# Patient Record
Sex: Female | Born: 1975
Health system: Southern US, Community
[De-identification: ages and names within clinical notes are randomized; demographics above are authoritative.]

---

## 2005-11-03 ENCOUNTER — Observation Stay: Payer: Self-pay | Admitting: Obstetrics & Gynecology

## 2006-02-18 ENCOUNTER — Inpatient Hospital Stay: Payer: Self-pay

## 2015-06-28 ENCOUNTER — Emergency Department: Payer: BLUE CROSS/BLUE SHIELD

## 2015-06-28 ENCOUNTER — Encounter: Payer: Self-pay | Admitting: Emergency Medicine

## 2015-06-28 ENCOUNTER — Emergency Department
Admission: EM | Admit: 2015-06-28 | Discharge: 2015-06-28 | Disposition: A | Discharge status: Home / Self Care | Payer: BLUE CROSS/BLUE SHIELD | Attending: Emergency Medicine | Admitting: Emergency Medicine

## 2015-06-28 DIAGNOSIS — Y92322 Soccer field as the place of occurrence of the external cause: Secondary | ICD-10-CM | POA: Diagnosis not present

## 2015-06-28 DIAGNOSIS — Y998 Other external cause status: Secondary | ICD-10-CM | POA: Diagnosis not present

## 2015-06-28 DIAGNOSIS — Y9366 Activity, soccer: Secondary | ICD-10-CM | POA: Diagnosis not present

## 2015-06-28 DIAGNOSIS — X58XXXA Exposure to other specified factors, initial encounter: Secondary | ICD-10-CM | POA: Diagnosis not present

## 2015-06-28 DIAGNOSIS — S93402A Sprain of unspecified ligament of left ankle, initial encounter: Secondary | ICD-10-CM | POA: Insufficient documentation

## 2015-06-28 DIAGNOSIS — S99912A Unspecified injury of left ankle, initial encounter: Secondary | ICD-10-CM | POA: Diagnosis present

## 2015-06-28 MED ORDER — HYDROCODONE-ACETAMINOPHEN 5-325 MG PO TABS: 1.0000 | ORAL_TABLET | ORAL | Status: DC | PRN

## 2015-06-28 MED ORDER — IBUPROFEN 800 MG PO TABS: 800.0000 mg | ORAL_TABLET | Freq: Three times a day (TID) | ORAL | Status: DC | PRN

## 2015-06-28 NOTE — ED Notes (Signed)
Patient to ED with c/o left ankle pain after twisting while playing ball.

## 2015-06-28 NOTE — ED Notes (Signed)
Pt hurt ankle playing soccer. States pain is about a 4 but every so often increases to a 6. Ice applied.

## 2015-06-28 NOTE — ED Provider Notes (Signed)
[facility] Emergency Department Provider Note  ____________________________________________  Time seen: Approximately 10:57 PM  I have reviewed the triage vital signs and the nursing notes.   HISTORY  Chief Complaint Ankle Pain    HPI Kimberly Rich is a 39 y.o. female who presents for evaluation of left ankle pain secondary to soccer injury. Complains of increased swelling and pain to the lateral dorsal aspect of the left ankle.   No past medical history on file.  There are no active problems to display for this patient.   No past surgical history on file.  Current Outpatient Rx  Name  Route  Sig  Dispense  Refill  . HYDROcodone-acetaminophen (NORCO) 5-325 MG per tablet   Oral   Take 1-2 tablets by mouth every 4 (four) hours as needed for moderate pain.   12 tablet   0   . ibuprofen (ADVIL,MOTRIN) 800 MG tablet   Oral   Take 1 tablet (800 mg total) by mouth every 8 (eight) hours as needed.   30 tablet   0     Allergies Review of patient's allergies indicates no known allergies.  No family history on file.  Social History History  Substance Use Topics  . Smoking status: Never Smoker   . Smokeless tobacco: Not on file  . Alcohol Use: No    Review of Systems Constitutional: No fever/chills Eyes: No visual changes. ENT: No sore throat. Cardiovascular: Denies chest pain. Respiratory: Denies shortness of breath. Gastrointestinal: No abdominal pain.  No nausea, no vomiting.  No diarrhea.  No constipation. Genitourinary: Negative for dysuria. Musculoskeletal: Positive for left ankle pain. Skin: Negative for rash. Neurological: Negative for headaches, focal weakness or numbness.  10-point ROS otherwise negative.  ____________________________________________   PHYSICAL EXAM:  VITAL SIGNS: ED Triage Vitals  Enc Vitals Group     BP 06/28/15 2031 109/60 mmHg     Pulse Rate 06/28/15 2031 66     Resp 06/28/15 2031 20   Temp 06/28/15 2031 98.8 F (37.1 C)     Temp Source 06/28/15 2031 Oral     SpO2 06/28/15 2031 99 %     Weight 06/28/15 2031 130 lb (58.968 kg)     Height 06/28/15 2031 5\' 2"  (1.575 m)     Head Cir --      Peak Flow --      Pain Score 06/28/15 2025 6     Pain Loc --      Pain Edu? --      Excl. in GC? --     Constitutional: Alert and oriented. Well appearing and in no acute distress. Eyes: Conjunctivae are normal. PERRL. EOMI. Head: Atraumatic. Nose: No congestion/rhinnorhea. Mouth/Throat: Mucous membranes are moist.  Oropharynx non-erythematous. Neck: No stridor.   Cardiovascular: Normal rate, regular rhythm. Grossly normal heart sounds.  Good peripheral circulation. Respiratory: Normal respiratory effort.  No retractions. Lungs CTAB. Gastrointestinal: Soft and nontender. No distention. No abdominal bruits. No CVA tenderness. Musculoskeletal: No lower extremity tenderness nor edema.  No joint effusions. Neurologic:  Normal speech and language. No gross focal neurologic deficits are appreciated. No gait instability. Skin:  Skin is warm, dry and intact. No rash noted. Psychiatric: Mood and affect are normal. Speech and behavior are normal.  ____________________________________________   LABS (all labs ordered are listed, but only abnormal results are displayed)  Labs Reviewed - No data to display ____________________________________________   RADIOLOGY  Left ankle x-ray negative interpreted by radiologist and reviewed by myself.  ____________________________________________   PROCEDURES  Procedure(s) performed: None  Critical Care performed: No  ____________________________________________   INITIAL IMPRESSION / ASSESSMENT AND PLAN / ED COURSE  Pertinent labs & imaging results that were available during my care of the patient were reviewed by me and considered in my medical decision making (see chart for details).  Soft Ace wrap applied. Patient refuses crutches.  Rx given hydrocodone and ibuprofen. Will follow up with PCP or orthopedics if continued pain. She voices no other emergency medical complaints at this time. ____________________________________________   FINAL CLINICAL IMPRESSION(S) / ED DIAGNOSES  Final diagnoses:  Ankle sprain, left, initial encounter      Evangeline Dakin, PA-C 06/28/15 2311  Maurilio Lovely, MD 06/29/15 0005

## 2015-06-28 NOTE — Discharge Instructions (Signed)

## 2016-06-13 DIAGNOSIS — H00031 Abscess of right upper eyelid: Secondary | ICD-10-CM | POA: Diagnosis not present

## 2017-04-04 DIAGNOSIS — L700 Acne vulgaris: Secondary | ICD-10-CM | POA: Diagnosis not present

## 2017-07-11 DIAGNOSIS — L700 Acne vulgaris: Secondary | ICD-10-CM | POA: Diagnosis not present

## 2018-02-27 ENCOUNTER — Encounter: Payer: Self-pay | Admitting: Obstetrics and Gynecology

## 2018-02-27 ENCOUNTER — Ambulatory Visit (INDEPENDENT_AMBULATORY_CARE_PROVIDER_SITE_OTHER): Payer: BLUE CROSS/BLUE SHIELD | Admitting: Obstetrics and Gynecology

## 2018-02-27 VITALS — BP 102/68 | HR 58 | Ht 62.0 in | Wt 138.0 lb

## 2018-02-27 DIAGNOSIS — Z01419 Encounter for gynecological examination (general) (routine) without abnormal findings: Secondary | ICD-10-CM | POA: Diagnosis not present

## 2018-02-27 DIAGNOSIS — Z1239 Encounter for other screening for malignant neoplasm of breast: Secondary | ICD-10-CM

## 2018-02-27 DIAGNOSIS — Z1151 Encounter for screening for human papillomavirus (HPV): Secondary | ICD-10-CM | POA: Diagnosis not present

## 2018-02-27 DIAGNOSIS — Z124 Encounter for screening for malignant neoplasm of cervix: Secondary | ICD-10-CM | POA: Diagnosis not present

## 2018-02-27 DIAGNOSIS — N6320 Unspecified lump in the left breast, unspecified quadrant: Secondary | ICD-10-CM

## 2018-02-27 DIAGNOSIS — Z1321 Encounter for screening for nutritional disorder: Secondary | ICD-10-CM

## 2018-02-27 DIAGNOSIS — Z1231 Encounter for screening mammogram for malignant neoplasm of breast: Secondary | ICD-10-CM

## 2018-02-27 DIAGNOSIS — Z13220 Encounter for screening for lipoid disorders: Secondary | ICD-10-CM

## 2018-02-27 DIAGNOSIS — Z0000 Encounter for general adult medical examination without abnormal findings: Secondary | ICD-10-CM

## 2018-02-27 NOTE — Patient Instructions (Signed)
I value your feedback and entrusting us with your care. If you get a [facility] patient survey, I would appreciate you taking the time to let us know about your experience today. Thank you! 

## 2018-02-27 NOTE — Progress Notes (Signed)
PCP:  Patient, No Pcp Per   Chief Complaint  Patient presents with  . Gynecologic Exam    Lump in left breast, with some achiness on left side into armpit      HPI:      Ms. Kimberly Rich is a 42 y.o. [id number] who LMP was Patient's last menstrual period was 02/10/2018 (exact date)., presents today for her NP annual examination.  Her menses are regular every 28-30 days, lasting 5 days.  Dysmenorrhea mild, occurring first 1-2 days of flow. She does not have intermenstrual bleeding.  Sex activity: single partner, contraception - vasectomy.  Last Pap: not recently  Last mammogram: never There is no FH of breast cancer. There is no FH of ovarian cancer. The patient does do self-breast exams. She noticed a LT breast mass several months ago. No change in size since first palpated. No tenderness of mass but LT axillary area is intermittently tender/achy but not related to menses. No hx of breast mass. Drinks caffeine.  Tobacco use: The patient denies current or previous tobacco use. Alcohol use: none No drug use.  Exercise: moderately active  She does not get adequate calcium and Vitamin D in her diet.  No recent labs.   History reviewed. No pertinent past medical history.  Past Surgical History:  Procedure Laterality Date  . CESAREAN SECTION  [phone]    Family History  Problem Relation Age of Onset  . Diabetes Mother     Social History   Socioeconomic History  . Marital status: Married    Spouse name: Not on file  . Number of children: Not on file  . Years of education: Not on file  . Highest education level: Not on file  Occupational History  . Not on file  Social Needs  . Financial resource strain: Not on file  . Food insecurity:    Worry: Not on file    Inability: Not on file  . Transportation needs:    Medical: Not on file    Non-medical: Not on file  Tobacco Use  . Smoking status: Never Smoker  . Smokeless tobacco: Never Used  Substance and  Sexual Activity  . Alcohol use: No  . Drug use: No  . Sexual activity: Yes    Birth control/protection: None    Comment: Vasectomy   Lifestyle  . Physical activity:    Days per week: Not on file    Minutes per session: Not on file  . Stress: Not on file  Relationships  . Social connections:    Talks on phone: Not on file    Gets together: Not on file    Attends religious service: Not on file    Active member of club or organization: Not on file    Attends meetings of clubs or organizations: Not on file    Relationship status: Not on file  . Intimate partner violence:    Fear of current or ex partner: Not on file    Emotionally abused: Not on file    Physically abused: Not on file    Forced sexual activity: Not on file  Other Topics Concern  . Not on file  Social History Narrative  . Not on file    Outpatient Medications Prior to Visit  Medication Sig Dispense Refill  . HYDROcodone-acetaminophen (NORCO) 5-325 MG per tablet Take 1-2 tablets by mouth every 4 (four) hours as needed for moderate pain. (Patient not taking: Reported on 02/27/2018) 12 tablet 0  .  ibuprofen (ADVIL,MOTRIN) 800 MG tablet Take 1 tablet (800 mg total) by mouth every 8 (eight) hours as needed. (Patient not taking: Reported on 02/27/2018) 30 tablet 0   No facility-administered medications prior to visit.       ROS:  Review of Systems  Constitutional: Negative for fatigue, fever and unexpected weight change.  Respiratory: Negative for cough, shortness of breath and wheezing.   Cardiovascular: Negative for chest pain, palpitations and leg swelling.  Gastrointestinal: Negative for blood in stool, constipation, diarrhea, nausea and vomiting.  Endocrine: Negative for cold intolerance, heat intolerance and polyuria.  Genitourinary: Positive for vaginal discharge. Negative for dyspareunia, dysuria, flank pain, frequency, genital sores, hematuria, menstrual problem, pelvic pain, urgency, vaginal bleeding and  vaginal pain.  Musculoskeletal: Negative for back pain, joint swelling and myalgias.  Skin: Negative for rash.  Neurological: Negative for dizziness, syncope, light-headedness, numbness and headaches.  Hematological: Negative for adenopathy.  Psychiatric/Behavioral: Negative for agitation, confusion, sleep disturbance and suicidal ideas. The patient is not nervous/anxious.    BREAST: mass, tenderness   Objective: BP 102/68   Pulse (!) 58   Ht 5\' 2"  (1.575 m)   Wt 138 lb (62.6 kg)   LMP 02/10/2018 (Exact Date)   BMI 25.24 kg/m    Physical Exam  Constitutional: She is oriented to person, place, and time. She appears well-developed and well-nourished.  Genitourinary: Vagina normal and uterus normal. There is no rash or tenderness on the right labia. There is no rash or tenderness on the left labia. No erythema or tenderness in the vagina. No vaginal discharge found. Right adnexum does not display mass and does not display tenderness. Left adnexum does not display mass and does not display tenderness. Cervix does not exhibit motion tenderness or polyp. Uterus is not enlarged or tender.  Neck: Normal range of motion. No thyromegaly present.  Cardiovascular: Normal rate, regular rhythm and normal heart sounds.  No murmur heard. Pulmonary/Chest: Effort normal and breath sounds normal. Right breast exhibits no mass, no nipple discharge, no skin change and no tenderness. Left breast exhibits no mass, no nipple discharge, no skin change and no tenderness.  LT BREAST UNDER AREOLA ~8:00-10:00 POS WITH ~2 X 3.5 CM FIRM, MOBILE MASS; NT; LT AXILLA IS TENDER TO PALPATION WITHOUT MASS; NO ERYTHEMA    Abdominal: Soft. There is no tenderness. There is no guarding.  Musculoskeletal: Normal range of motion.  Neurological: She is alert and oriented to person, place, and time. No cranial nerve deficit.  Psychiatric: She has a normal mood and affect. Her behavior is normal.  Vitals  reviewed.   Assessment/Plan: Encounter for annual routine gynecological examination  Cervical cancer screening - Plan: IGP, Aptima HPV  Screening for HPV (human papillomavirus) - Plan: IGP, Aptima HPV  Left breast mass - 8:00-10:00 pos, check dx mammo and u/s. Will f/u wiht results.  - Plan: MM DIAG BREAST TOMO BILATERAL, US BREAST LTD UNI LEFT INC AXILLA  Screening for breast cancer - Plan: MM DIAG BREAST TOMO BILATERAL, US BREAST LTD UNI LEFT INC AXILLA  Blood tests for routine general physical examination - Plan: Comprehensive metabolic panel, Lipid panel, VITAMIN D 25 Hydroxy (Vit-D Deficiency, Fractures)  Screening cholesterol level - Plan: Lipid panel  Encounter for vitamin deficiency screening - Plan: VITAMIN D 25 Hydroxy (Vit-D Deficiency, Fractures)     GYN counsel breast self exam, mammography screening, adequate intake of calcium and vitamin D, diet and exercise     F/U  Return in about 1 year (  around 02/28/2019).  Alicia B. Copland, PA-C 02/27/2018 9:24 AM

## 2018-03-02 LAB — IGP, APTIMA HPV
HPV Aptima: NEGATIVE
PAP Smear Comment: 0

## 2018-03-07 ENCOUNTER — Other Ambulatory Visit: Payer: BLUE CROSS/BLUE SHIELD

## 2018-03-07 DIAGNOSIS — Z13220 Encounter for screening for lipoid disorders: Secondary | ICD-10-CM

## 2018-03-07 DIAGNOSIS — Z0000 Encounter for general adult medical examination without abnormal findings: Secondary | ICD-10-CM

## 2018-03-07 DIAGNOSIS — Z1321 Encounter for screening for nutritional disorder: Secondary | ICD-10-CM | POA: Diagnosis not present

## 2018-03-08 ENCOUNTER — Other Ambulatory Visit: Payer: Self-pay | Admitting: Obstetrics and Gynecology

## 2018-03-08 ENCOUNTER — Ambulatory Visit
Admission: RE | Admit: 2018-03-08 | Discharge: 2018-03-08 | Disposition: A | Discharge status: Home / Self Care | Payer: BLUE CROSS/BLUE SHIELD | Source: Ambulatory Visit | Attending: Obstetrics and Gynecology | Admitting: Obstetrics and Gynecology

## 2018-03-08 DIAGNOSIS — N6310 Unspecified lump in the right breast, unspecified quadrant: Secondary | ICD-10-CM

## 2018-03-08 DIAGNOSIS — N6320 Unspecified lump in the left breast, unspecified quadrant: Secondary | ICD-10-CM

## 2018-03-08 DIAGNOSIS — Z1239 Encounter for other screening for malignant neoplasm of breast: Secondary | ICD-10-CM

## 2018-03-08 DIAGNOSIS — N6323 Unspecified lump in the left breast, lower outer quadrant: Secondary | ICD-10-CM | POA: Diagnosis not present

## 2018-03-08 DIAGNOSIS — N6002 Solitary cyst of left breast: Secondary | ICD-10-CM | POA: Diagnosis not present

## 2018-03-08 DIAGNOSIS — N6321 Unspecified lump in the left breast, upper outer quadrant: Secondary | ICD-10-CM | POA: Insufficient documentation

## 2018-03-08 DIAGNOSIS — R922 Inconclusive mammogram: Secondary | ICD-10-CM | POA: Diagnosis not present

## 2018-03-08 DIAGNOSIS — N6001 Solitary cyst of right breast: Secondary | ICD-10-CM | POA: Insufficient documentation

## 2018-03-08 LAB — COMPREHENSIVE METABOLIC PANEL
ALK PHOS: 54 IU/L (ref 39–117)
ALT: 7 IU/L (ref 0–32)
AST: 13 IU/L (ref 0–40)
Albumin/Globulin Ratio: 1.7 (ref 1.2–2.2)
Albumin: 4 g/dL (ref 3.5–5.5)
BUN/Creatinine Ratio: 18 (ref 9–23)
BUN: 14 mg/dL (ref 6–24)
CHLORIDE: 104 mmol/L (ref 96–106)
CO2: 24 mmol/L (ref 20–29)
Calcium: 8.9 mg/dL (ref 8.7–10.2)
Creatinine, Ser: 0.78 mg/dL (ref 0.57–1.00)
GFR calc Af Amer: 110 mL/min/{1.73_m2} (ref >59)
GFR calc non Af Amer: 95 mL/min/{1.73_m2} (ref >59)
GLUCOSE: 84 mg/dL (ref 65–99)
Globulin, Total: 2.3 g/dL (ref 1.5–4.5)
Potassium: 4.3 mmol/L (ref 3.5–5.2)
Sodium: 143 mmol/L (ref 134–144)
Total Protein: 6.3 g/dL (ref 6.0–8.5)

## 2018-03-08 LAB — VITAMIN D 25 HYDROXY (VIT D DEFICIENCY, FRACTURES): Vit D, 25-Hydroxy: 22.3 ng/mL — ABNORMAL LOW (ref 30.0–100.0)

## 2018-03-08 LAB — LIPID PANEL
CHOL/HDL RATIO: 3.2 ratio (ref 0.0–4.4)
Cholesterol, Total: 167 mg/dL (ref 100–199)
HDL: 52 mg/dL (ref >39)
LDL Calculated: 104 mg/dL — ABNORMAL HIGH (ref 0–99)
Triglycerides: 55 mg/dL (ref 0–149)
VLDL Cholesterol Cal: 11 mg/dL (ref 5–40)

## 2018-03-13 ENCOUNTER — Telehealth: Payer: Self-pay | Admitting: Obstetrics and Gynecology

## 2018-03-13 DIAGNOSIS — N6320 Unspecified lump in the left breast, unspecified quadrant: Secondary | ICD-10-CM

## 2018-03-13 NOTE — Telephone Encounter (Signed)
Pt aware of mammo results. She is ok to repeat u/s in 6 months. Knows to f/u sooner if painful or size increase. Would refer to gen surg for mgmt. F/u prn.

## 2018-03-14 ENCOUNTER — Telehealth: Payer: Self-pay | Admitting: Obstetrics and Gynecology

## 2018-03-14 NOTE — Telephone Encounter (Signed)
Patient is [facility] of [facility] mos [facility] on Monday, 09/09/18 @ 9:20pm. Patient said she just saw the appointment on MyChart.

## 2018-09-09 ENCOUNTER — Other Ambulatory Visit: Payer: BLUE CROSS/BLUE SHIELD

## 2019-11-04 ENCOUNTER — Emergency Department
Admission: EM | Admit: 2019-11-04 | Discharge: 2019-11-04 | Disposition: A | Discharge status: Home / Self Care | Payer: PRIVATE HEALTH INSURANCE | Attending: Emergency Medicine | Admitting: Emergency Medicine

## 2019-11-04 ENCOUNTER — Emergency Department: Payer: PRIVATE HEALTH INSURANCE

## 2019-11-04 ENCOUNTER — Encounter: Payer: Self-pay | Admitting: Emergency Medicine

## 2019-11-04 ENCOUNTER — Other Ambulatory Visit: Payer: Self-pay

## 2019-11-04 DIAGNOSIS — S0003XA Contusion of scalp, initial encounter: Secondary | ICD-10-CM | POA: Diagnosis not present

## 2019-11-04 DIAGNOSIS — Y990 Civilian activity done for income or pay: Secondary | ICD-10-CM | POA: Diagnosis not present

## 2019-11-04 DIAGNOSIS — Y9389 Activity, other specified: Secondary | ICD-10-CM | POA: Diagnosis not present

## 2019-11-04 DIAGNOSIS — S0990XA Unspecified injury of head, initial encounter: Secondary | ICD-10-CM | POA: Diagnosis present

## 2019-11-04 DIAGNOSIS — R202 Paresthesia of skin: Secondary | ICD-10-CM | POA: Diagnosis not present

## 2019-11-04 DIAGNOSIS — W208XXA Other cause of strike by thrown, projected or falling object, initial encounter: Secondary | ICD-10-CM | POA: Diagnosis not present

## 2019-11-04 DIAGNOSIS — Y9289 Other specified places as the place of occurrence of the external cause: Secondary | ICD-10-CM | POA: Diagnosis not present

## 2019-11-04 NOTE — ED Notes (Addendum)
[phone] and [phone] [employer] Benefits and Workers Comp, attempted to call both numbers without success to verify whether or not WC UDS was needed.

## 2019-11-04 NOTE — ED Provider Notes (Signed)
Emergency Department Provider Note  ____________________________________________  Time seen: Approximately 4:51 PM  I have reviewed the triage vital signs and the nursing notes.   HISTORY  Chief Complaint Head Laceration   Historian Patient     HPI Kimberly Rich is a 43 y.o. female presents to the emergency department with new onset left-sided parietal scalp numbness and diminished sensation after patient reports that a 40 pound kayak fell on her head yesterday while unloading.  Patient sustained a 2 cm laceration to left side of forehead which she Steri-Stripped at home.  Patient states that she had a mild headache at the time which is since resolved but became concerned about changes in scalp sensation.  She denies numbness or tingling in the upper or lower extremities.  No neck pain.  She denies blurry vision or vertigo.  No changes in strength in the upper or lower extremities.  She has been able to ambulate easily and has never experienced similar symptoms in the past.  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  Patient states that if her CT head is nonconcerning, she would like to return to work today.   History reviewed. No pertinent past medical history.   Immunizations up to date:  Yes.     History reviewed. No pertinent past medical history.  There are no problems to display for this patient.   Past Surgical History:  Procedure Laterality Date  . CESAREAN SECTION  [phone]    Prior to Admission medications   Not on File    Allergies Patient has no known allergies.  Family History  Problem Relation Age of Onset  . Diabetes Mother   . Hypertension Father   . Hyperlipidemia Father     Social History Social History   Tobacco Use  . Smoking status: Never Smoker  . Smokeless tobacco: Never Used  Substance Use Topics  . Alcohol use: No  . Drug use: No     Review of Systems   Constitutional: No fever/chills Eyes:  No discharge ENT: No upper respiratory complaints. Respiratory: no cough. No SOB/ use of accessory muscles to breath Gastrointestinal:   No nausea, no vomiting.  No diarrhea.  No constipation. Musculoskeletal: Negative for musculoskeletal pain. Skin: Patient has forehead laceration and scalp paresthesias.    ____________________________________________   PHYSICAL EXAM:  VITAL SIGNS: ED Triage Vitals  Enc Vitals Group     BP 11/04/19 1549 118/62     Pulse Rate 11/04/19 1549 (!) 56     Resp 11/04/19 1549 18     Temp 11/04/19 1549 98.9 F (37.2 C)     Temp Source 11/04/19 1549 Oral     SpO2 11/04/19 1549 99 %     Weight 11/04/19 1550 130 lb (59 kg)     Height 11/04/19 1550 5\' 2"  (1.575 m)     Head Circumference --      Peak Flow --      Pain Score 11/04/19 1549 0     Pain Loc --      Pain Edu? --      Excl. in GC? --      Constitutional: Alert and oriented. Well appearing and in no acute distress. Eyes: Conjunctivae are normal. PERRL. EOMI. Head: Atraumatic.  No palpable hematomas. ENT:      Nose: No congestion/rhinnorhea.      Mouth/Throat: Mucous membranes are moist.  Neck: No stridor.  No cervical spine tenderness to palpation. Cardiovascular: Normal rate, regular rhythm. Normal S1 and S2.  Good peripheral circulation. Respiratory: Normal respiratory effort without tachypnea or retractions. Lungs CTAB. Good air entry to the bases with no decreased or absent breath sounds Gastrointestinal: Bowel sounds x 4 quadrants. Soft and nontender to palpation. No guarding or rigidity. No distention. Musculoskeletal: Full range of motion to all extremities. No obvious deformities noted Neurologic:  Normal for age. No gross focal neurologic deficits are appreciated.  Skin: Patient has 2 cm left-sided forehead laceration that was Steri-Stripped in place. Psychiatric: Mood and affect are normal for age. Speech and behavior are normal.    ____________________________________________   LABS (all labs ordered are listed, but only abnormal results are displayed)  Labs Reviewed - No data to display ____________________________________________  EKG   ____________________________________________  RADIOLOGY Unk Pinto, personally viewed and evaluated these images (plain radiographs) as part of my medical decision making, as well as reviewing the written report by the radiologist.    CT Head Wo Contrast  Result Date: 11/04/2019 CLINICAL DATA:  Facial trauma with laceration. EXAM: CT HEAD WITHOUT CONTRAST TECHNIQUE: Contiguous axial images were obtained from the base of the skull through the vertex without intravenous contrast. COMPARISON:  None. FINDINGS: Brain: The ventricles are normal in size and configuration. No extra-axial fluid collections are identified. The gray-white differentiation is maintained. No CT findings for acute hemispheric infarction or intracranial hemorrhage. No mass lesions. The brainstem and cerebellum are normal. Vascular: No hyperdense vessels or obvious aneurysm. Skull: No acute skull fracture.  No bone lesion. Sinuses/Orbits: The paranasal sinuses and mastoid air cells are clear. The globes are intact. Other: No scalp lesions, laceration or hematoma. IMPRESSION: No acute intracranial findings or skull fracture. Electronically Signed   By: Marijo Sanes M.D.   On: 11/04/2019 17:29    ____________________________________________    PROCEDURES  Procedure(s) performed:     Procedures     Medications - No data to display   ____________________________________________   INITIAL IMPRESSION / ASSESSMENT AND PLAN / ED COURSE  Pertinent labs & imaging results that were available during my care of the patient were reviewed by me and considered in my medical decision making (see chart for details).      Assessment and Plan: Head Contusion:  43 year old female presents to the  emergency department with changes in scalp sensation after patient reports that she was struck in the head while unloading a kayak.  Patient did not lose consciousness and has not been having vertigo, blurry vision, disorientation or confusion.  She does not currently have a headache.  Patient was mildly bradycardic at triage but vital signs were otherwise reassuring.  Neuro exam was reassuring.  Patient was able to perform rapid alternating movements and had no hypo or hyperreflexia.  Romberg test was negative.  Differential diagnosis includes concussion, head contusion, intracranial bleed, skull fracture...  CT head reveals no evidence of intracranial bleed or skull fracture.  Tylenol was recommended for discomfort.  All patient questions were answered.  ____________________________________________  FINAL CLINICAL IMPRESSION(S) / ED DIAGNOSES  Final diagnoses:  Contusion of scalp, initial encounter      NEW MEDICATIONS STARTED DURING THIS VISIT:  ED Discharge Orders    None          This chart was dictated using voice recognition software/Dragon. Despite best efforts to proofread, errors can occur which can change the meaning. Any change was purely unintentional.     Vallarie Mare  Blair HeysM, PA-C 11/04/19 1739    Arnaldo NatalMalinda, Paul F, MD 11/04/19 [phone]

## 2019-11-04 NOTE — ED Notes (Signed)
See triage note  Presents s/p w/c injury  States she fell yesterday  And had head laceration  Butterfly suture in place  Sent over from [facility]

## 2019-11-04 NOTE — ED Notes (Signed)
First RN Note: Pt presents to ED via wheelchair from [facility] with c/o fall/laceration to L side of her head. Per [facility] pt is WC, injury happened yesterday, pt with butterfly stitches to L side of her head at this time. Pt is A&O x 4, ambulatory without difficulty at this time.

## 2019-11-04 NOTE — ED Triage Notes (Signed)
Pt denies LOC, ambulatory with steady gait at this time. A&O x4. Pt states numb sensation to L side of her head when the advil wears off along the area wear the laceration is.

## 2019-11-17 DIAGNOSIS — R519 Headache, unspecified: Secondary | ICD-10-CM | POA: Diagnosis not present

## 2019-11-17 DIAGNOSIS — R0981 Nasal congestion: Secondary | ICD-10-CM | POA: Diagnosis not present

## 2019-11-17 DIAGNOSIS — Z20828 Contact with and (suspected) exposure to other viral communicable diseases: Secondary | ICD-10-CM | POA: Diagnosis not present

## 2020-02-10 DIAGNOSIS — S61215A Laceration without foreign body of left ring finger without damage to nail, initial encounter: Secondary | ICD-10-CM | POA: Diagnosis not present

## 2020-02-10 DIAGNOSIS — Z23 Encounter for immunization: Secondary | ICD-10-CM | POA: Diagnosis not present

## 2020-04-15 DIAGNOSIS — Z03818 Encounter for observation for suspected exposure to other biological agents ruled out: Secondary | ICD-10-CM | POA: Diagnosis not present

## 2020-04-15 DIAGNOSIS — Z20828 Contact with and (suspected) exposure to other viral communicable diseases: Secondary | ICD-10-CM | POA: Diagnosis not present

## 2020-06-18 DIAGNOSIS — Z03818 Encounter for observation for suspected exposure to other biological agents ruled out: Secondary | ICD-10-CM | POA: Diagnosis not present

## 2020-06-18 DIAGNOSIS — Z20822 Contact with and (suspected) exposure to covid-19: Secondary | ICD-10-CM | POA: Diagnosis not present

## 2021-08-23 IMAGING — CT CT HEAD W/O CM
3 series · 15 of 46 positions shown, 18 images · non-contrast
Comparison: None.

CLINICAL DATA: Facial trauma with laceration.

EXAM:
CT HEAD WITHOUT CONTRAST
TECHNIQUE: Contiguous axial images were obtained from the base of the skull
through the vertex without intravenous contrast.

[Series 2: head wo · axial · 0.42mm/px · z∈[-121,-1]mm · 9 of 29 slices shown, 12 images]
[im 3/29  brain]
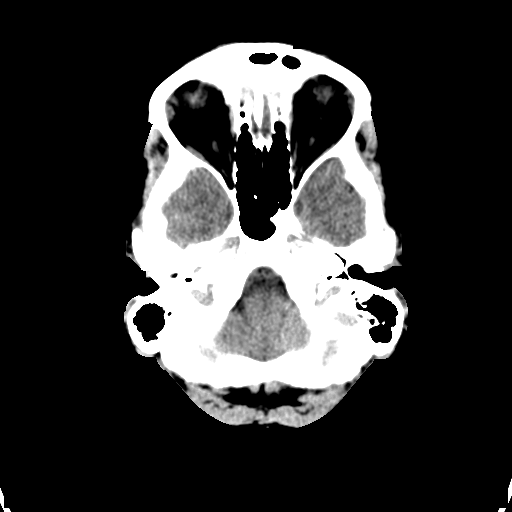
[im 3/29  bone]
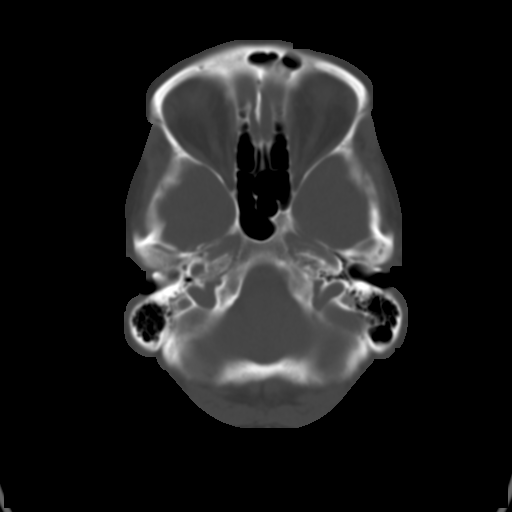
[im 6/29  brain]
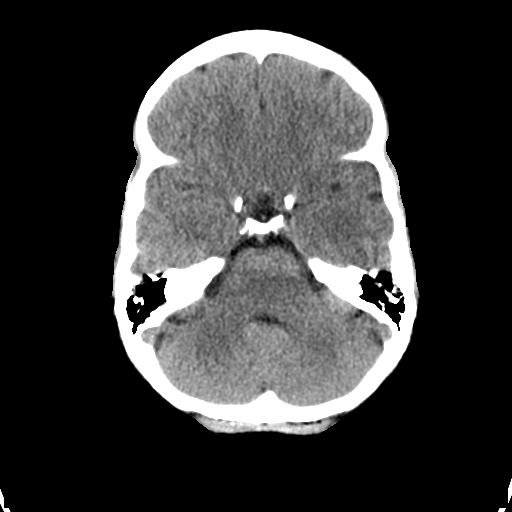
[im 9/29  brain]
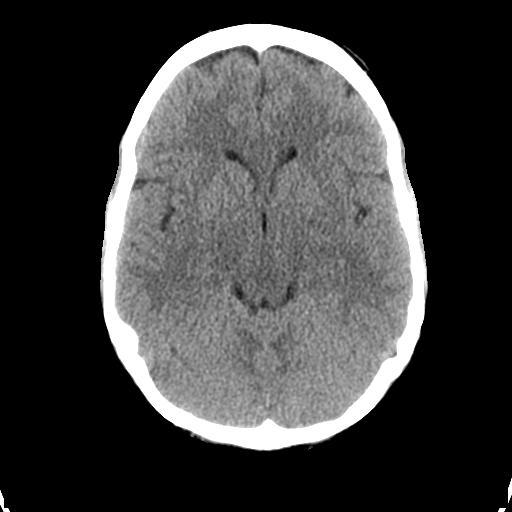
[im 12/29  brain]
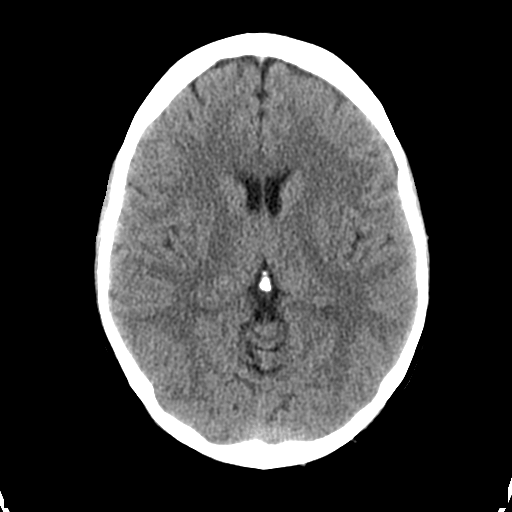
[im 15/29  brain]
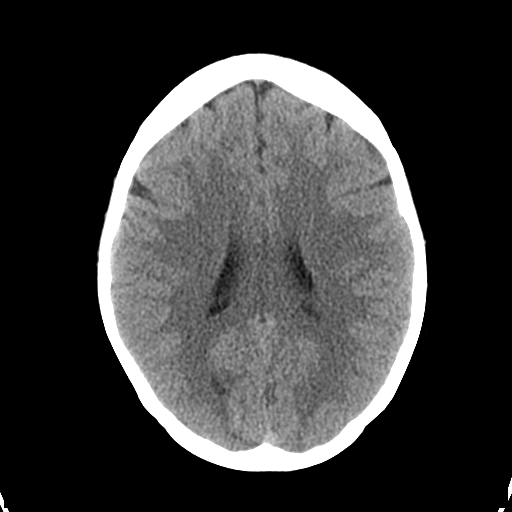
[im 15/29  bone]
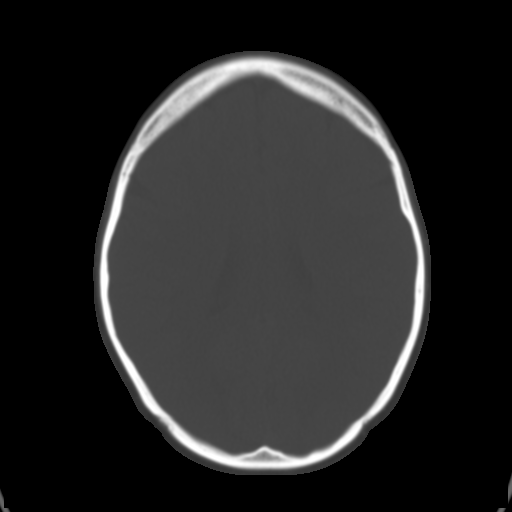
[im 18/29  brain]
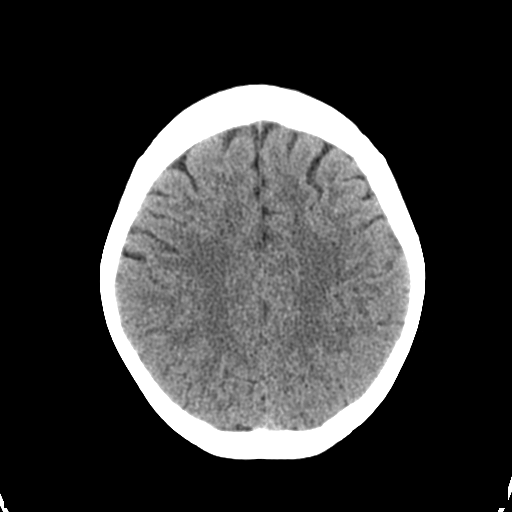
[im 21/29  brain]
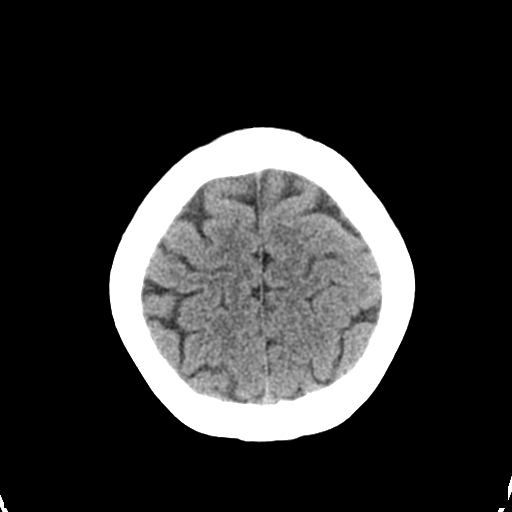
[im 24/29  brain]
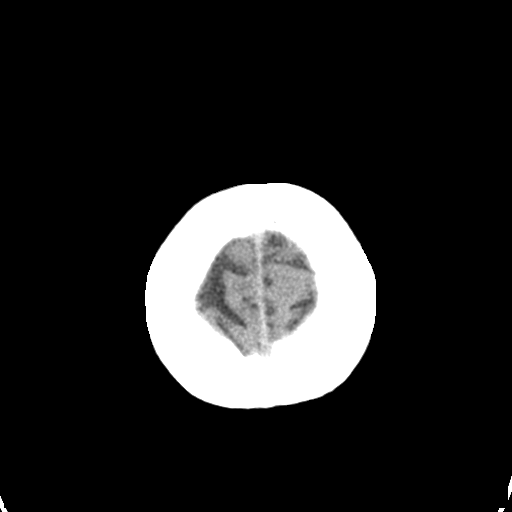
[im 27/29  brain]
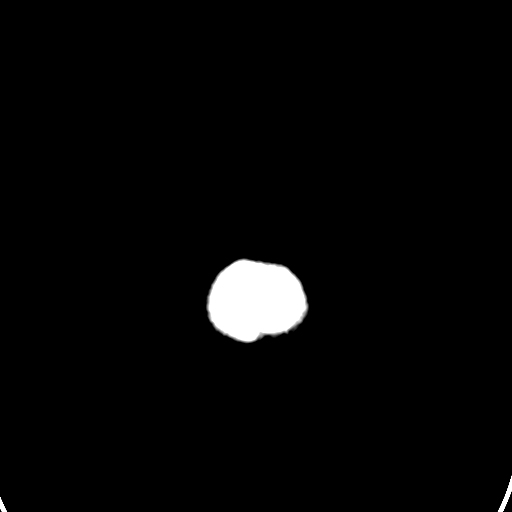
[im 27/29  bone]
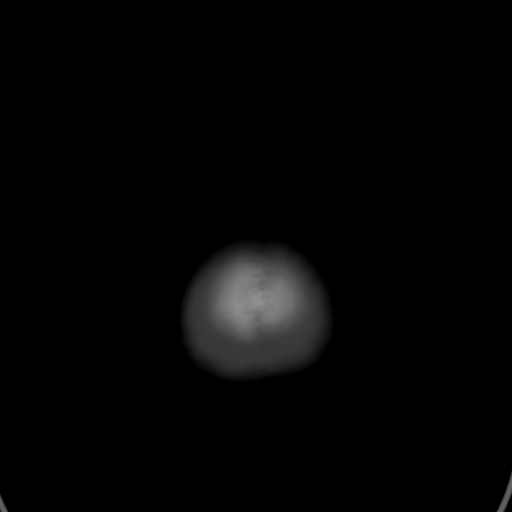

[Series 4: coronal soft tissue · coronal · 0.29mm/px · 3 of 64 slices shown]
[im 22/64  brain]
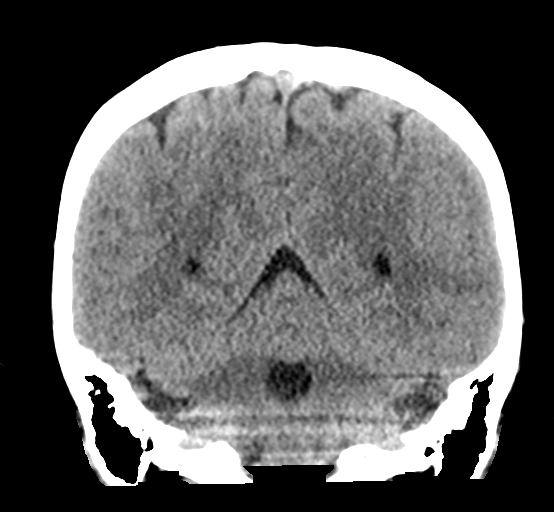
[im 29/64  brain]
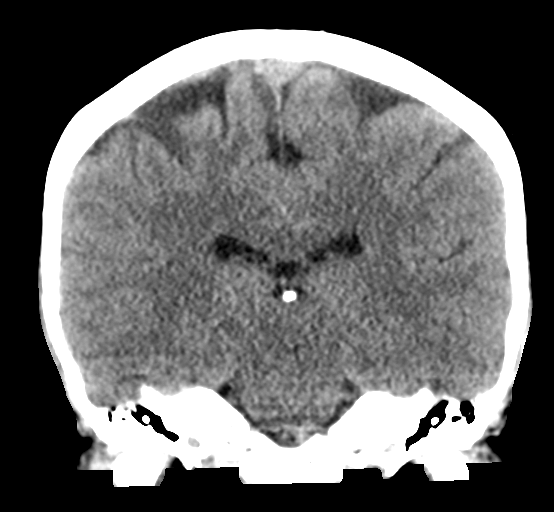
[im 36/64  brain]
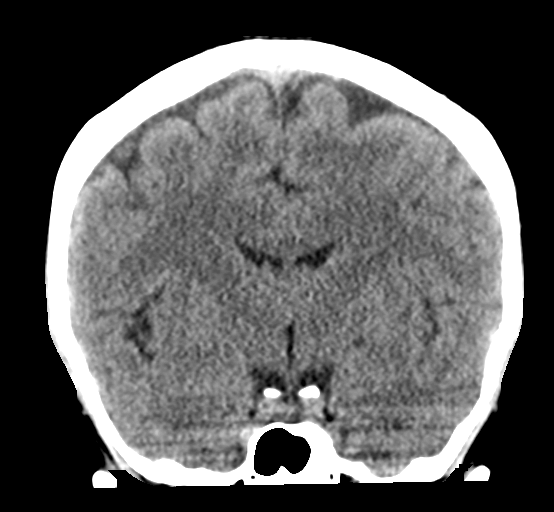

[Series 5: sagittal soft tissue · sagittal · 0.29mm/px · 3 of 48 slices shown]
[im 16/48  brain]
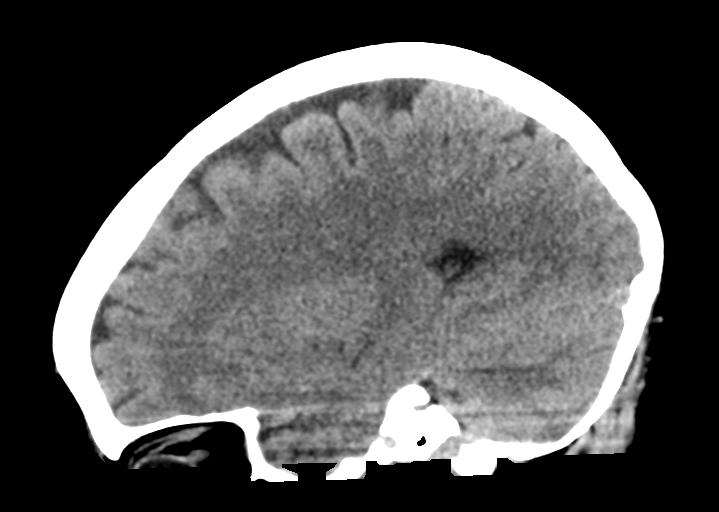
[im 24/48  brain]
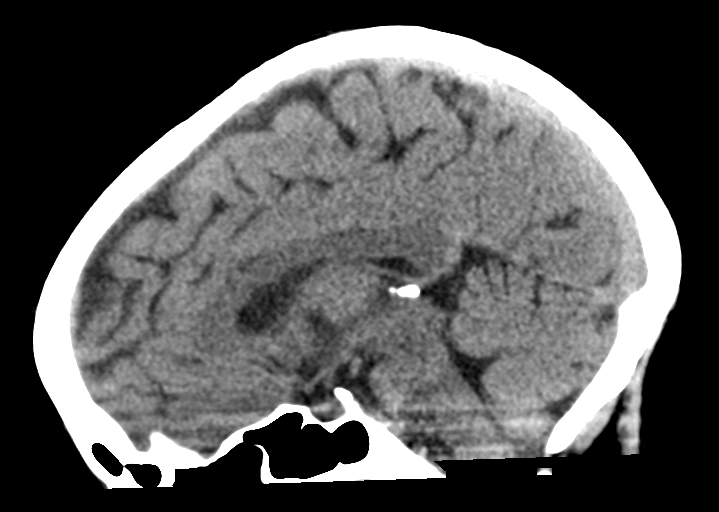
[im 32/48  brain]
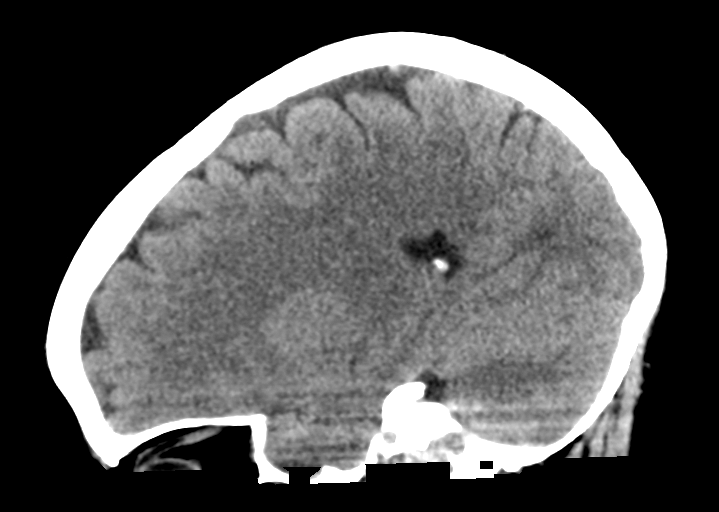

[15 of 46 positions shown; findings below may reference images not displayed]

FINDINGS: Brain: The ventricles are normal in size and configuration. No
extra-axial fluid collections are identified. The gray-white
differentiation is maintained. No CT findings for acute hemispheric
infarction or intracranial hemorrhage. No mass lesions. The
brainstem and cerebellum are normal.

Vascular: No hyperdense vessels or obvious aneurysm.

Skull: No acute skull fracture.  No bone lesion.

Sinuses/Orbits: The paranasal sinuses and mastoid air cells are
clear. The globes are intact.

Other: No scalp lesions, laceration or hematoma.
IMPRESSION: No acute intracranial findings or skull fracture.

## 2022-08-16 ENCOUNTER — Ambulatory Visit (INDEPENDENT_AMBULATORY_CARE_PROVIDER_SITE_OTHER): Payer: Managed Care, Other (non HMO) | Admitting: Family Medicine

## 2022-08-16 ENCOUNTER — Other Ambulatory Visit (HOSPITAL_COMMUNITY)
Admission: RE | Admit: 2022-08-16 | Discharge: 2022-08-16 | Disposition: A | Discharge status: Home / Self Care | Payer: Managed Care, Other (non HMO) | Source: Ambulatory Visit | Attending: Family Medicine | Admitting: Family Medicine

## 2022-08-16 ENCOUNTER — Encounter: Payer: Self-pay | Admitting: Family Medicine

## 2022-08-16 VITALS — BP 115/74 | HR 69 | Ht 62.0 in | Wt 148.6 lb

## 2022-08-16 DIAGNOSIS — Z01419 Encounter for gynecological examination (general) (routine) without abnormal findings: Secondary | ICD-10-CM

## 2022-08-16 DIAGNOSIS — Z1231 Encounter for screening mammogram for malignant neoplasm of breast: Secondary | ICD-10-CM | POA: Diagnosis not present

## 2022-08-16 DIAGNOSIS — Z1211 Encounter for screening for malignant neoplasm of colon: Secondary | ICD-10-CM

## 2022-08-16 DIAGNOSIS — Z124 Encounter for screening for malignant neoplasm of cervix: Secondary | ICD-10-CM

## 2022-08-16 NOTE — Progress Notes (Signed)
Subjective:     Kimberly Rich is a 46 y.o. female and is here for a comprehensive physical exam. The patient reports problems - breast . Breast tenderness with cycles. Has h/o breast cysts. Reports polyuria, feels thirsty more as well.  Cycles are monthly and no issues.  The following portions of the patient's history were reviewed and updated as appropriate: allergies, current medications, past family history, past medical history, past social history, past surgical history, and problem list.  Review of Systems Pertinent items noted in HPI and remainder of comprehensive ROS otherwise negative.   Objective:    BP 115/74   Pulse 69   Ht 5\' 2"  (1.575 m)   Wt 148 lb 9.6 oz (67.4 kg)   LMP 08/16/2022   BMI 27.18 kg/m  General appearance: alert, cooperative, and appears stated age Head: Normocephalic, without obvious abnormality, atraumatic Neck: no adenopathy, supple, symmetrical, trachea midline, and thyroid not enlarged, symmetric, no tenderness/mass/nodules Lungs: clear to auscultation bilaterally Breasts:  multiple cystic mobile lesions noted in breasts, non-tender largest is about 3 cm on right breast under nipple laterally and smaller mobile one noted on left breast below the nipple Heart: regular rate and rhythm Abdomen: soft, non-tender; bowel sounds normal; no masses,  no organomegaly Pelvic: cervix normal in appearance, external genitalia normal, no adnexal masses or tenderness, no cervical motion tenderness, uterus normal size, shape, and consistency, and vagina normal without discharge Extremities: extremities normal, atraumatic, no cyanosis or edema Pulses: 2+ and symmetric Skin: Skin color, texture, turgor normal. No rashes or lesions Lymph nodes: Cervical, supraclavicular, and axillary nodes normal. Neurologic: Grossly normal    Assessment:    Healthy female exam.      Plan:  Screening for malignant neoplasm of cervix - Plan: Cytology - PAP( [facility])  Encounter for gynecological examination without abnormal finding - Plan: MM 3D SCREEN BREAST BILATERAL, TSH, Hemoglobin A1c, Comprehensive metabolic panel, Lipid panel, CBC  Encounter for screening mammogram for malignant neoplasm of breast  Screen for colon cancer - Plan: Cologuard  Return in 1 year (on 08/17/2023).    See After Visit Summary for Counseling Recommendations

## 2022-08-16 NOTE — Patient Instructions (Signed)

## 2022-08-16 NOTE — Progress Notes (Signed)
Pt presents for annual exam. Pt decline flu shot. Pt c/o of frequent urination. Denies urinary incontinences.   Last PAP: 2019 Last mammogram: 02/2018

## 2022-08-17 LAB — CBC
Hematocrit: 37.5 % (ref 34.0–46.6)
Hemoglobin: 12.7 g/dL (ref 11.1–15.9)
MCH: 30.1 pg (ref 26.6–33.0)
MCHC: 33.9 g/dL (ref 31.5–35.7)
MCV: 89 fL (ref 79–97)
Platelets: 177 10*3/uL (ref 150–450)
RBC: 4.22 x10E6/uL (ref 3.77–5.28)
RDW: 12.4 % (ref 11.7–15.4)
WBC: 5.1 10*3/uL (ref 3.4–10.8)

## 2022-08-17 LAB — COMPREHENSIVE METABOLIC PANEL
ALT: 11 IU/L (ref 0–32)
AST: 16 IU/L (ref 0–40)
Albumin/Globulin Ratio: 1.7 (ref 1.2–2.2)
Albumin: 4.3 g/dL (ref 3.9–4.9)
Alkaline Phosphatase: 62 IU/L (ref 44–121)
BUN/Creatinine Ratio: 12 (ref 9–23)
BUN: 11 mg/dL (ref 6–24)
Bilirubin Total: <0.2 mg/dL (ref 0.0–1.2)
CO2: 25 mmol/L (ref 20–29)
Calcium: 9.2 mg/dL (ref 8.7–10.2)
Chloride: 100 mmol/L (ref 96–106)
Creatinine, Ser: 0.9 mg/dL (ref 0.57–1.00)
Globulin, Total: 2.5 g/dL (ref 1.5–4.5)
Glucose: 84 mg/dL (ref 70–99)
Potassium: 3.7 mmol/L (ref 3.5–5.2)
Sodium: 140 mmol/L (ref 134–144)
Total Protein: 6.8 g/dL (ref 6.0–8.5)
eGFR: 80 mL/min/{1.73_m2} (ref >59)

## 2022-08-17 LAB — HEMOGLOBIN A1C
Est. average glucose Bld gHb Est-mCnc: 120 mg/dL
Hgb A1c MFr Bld: 5.8 % — ABNORMAL HIGH (ref 4.8–5.6)

## 2022-08-17 LAB — LIPID PANEL
Chol/HDL Ratio: 4.3 ratio (ref 0.0–4.4)
Cholesterol, Total: 173 mg/dL (ref 100–199)
HDL: 40 mg/dL (ref >39)
LDL Chol Calc (NIH): 108 mg/dL — ABNORMAL HIGH (ref 0–99)
Triglycerides: 140 mg/dL (ref 0–149)
VLDL Cholesterol Cal: 25 mg/dL (ref 5–40)

## 2022-08-17 LAB — TSH: TSH: 1.51 u[IU]/mL (ref 0.450–4.500)

## 2022-08-21 LAB — CYTOLOGY - PAP
Comment: NEGATIVE
Diagnosis: NEGATIVE
High risk HPV: NEGATIVE

## 2022-09-06 LAB — COLOGUARD: COLOGUARD: NEGATIVE

## 2023-02-12 ENCOUNTER — Other Ambulatory Visit: Payer: Self-pay | Admitting: Family Medicine

## 2023-02-12 DIAGNOSIS — N6002 Solitary cyst of left breast: Secondary | ICD-10-CM

## 2023-03-06 ENCOUNTER — Ambulatory Visit
Admission: RE | Admit: 2023-03-06 | Discharge: 2023-03-06 | Disposition: A | Discharge status: Home / Self Care | Payer: Managed Care, Other (non HMO) | Source: Ambulatory Visit | Attending: Family Medicine | Admitting: Family Medicine

## 2023-03-06 DIAGNOSIS — N6002 Solitary cyst of left breast: Secondary | ICD-10-CM

## 2023-07-12 ENCOUNTER — Ambulatory Visit: Payer: Managed Care, Other (non HMO) | Admitting: Family Medicine

## 2023-07-12 ENCOUNTER — Other Ambulatory Visit: Payer: Self-pay | Admitting: *Deleted

## 2023-07-12 ENCOUNTER — Encounter: Payer: Self-pay | Admitting: Family Medicine

## 2023-07-12 VITALS — BP 110/74 | HR 67 | Wt 152.0 lb

## 2023-07-12 DIAGNOSIS — N6322 Unspecified lump in the left breast, upper inner quadrant: Secondary | ICD-10-CM | POA: Diagnosis not present

## 2023-07-12 DIAGNOSIS — N6001 Solitary cyst of right breast: Secondary | ICD-10-CM | POA: Insufficient documentation

## 2023-07-12 NOTE — Progress Notes (Signed)
    Subjective:    Patient ID: Kimberly Rich is a 47 y.o. female presenting with Breast Mass  on 07/12/2023  HPI: Has a known mass and it seems to be much larger and is quite tender.   Review of Systems  Constitutional:  Negative for chills and fever.  Respiratory:  Negative for shortness of breath.   Cardiovascular:  Negative for chest pain.  Gastrointestinal:  Negative for abdominal pain, nausea and vomiting.  Genitourinary:  Negative for dysuria.  Skin:  Negative for rash.      Objective:    BP 110/74   Pulse 67   Wt 152 lb (68.9 kg)   BMI 27.80 kg/m  Physical Exam Exam conducted with a chaperone present.  Constitutional:      General: She is not in acute distress.    Appearance: She is well-developed.  HENT:     Head: Normocephalic and atraumatic.  Eyes:     General: No scleral icterus. Cardiovascular:     Rate and Rhythm: Normal rate.  Pulmonary:     Effort: Pulmonary effort is normal.  Chest:    Abdominal:     Palpations: Abdomen is soft.  Musculoskeletal:     Cervical back: Neck supple.  Skin:    General: Skin is warm and dry.  Neurological:     Mental Status: She is alert and oriented to person, place, and time.         Assessment & Plan:   Problem List Items Addressed This Visit       Unprioritized   Mass of upper inner quadrant of left breast - Primary    Concerning mass that has grown rapidly. Had normal u/s and diagnostic mammogram in 02/2023. Urgent imaging ordered.      Relevant Orders   MM 3D DIAGNOSTIC MAMMOGRAM BILATERAL BREAST   US LIMITED ULTRASOUND INCLUDING AXILLA LEFT BREAST     Return if symptoms worsen or fail to improve.  Reva Bores, MD 07/12/2023 11:24 AM

## 2023-07-12 NOTE — Assessment & Plan Note (Signed)
Concerning mass that has grown rapidly. Had normal u/s and diagnostic mammogram in 02/2023. Urgent imaging ordered.

## 2023-07-12 NOTE — Progress Notes (Signed)
RGYN pt here for problem visit today.   CC: Pain in left Breast that is sore to touch, also notes pain when laying down.  Mammogram and breast U/S done on 03/06/23

## 2023-07-17 ENCOUNTER — Ambulatory Visit
Admission: RE | Admit: 2023-07-17 | Discharge: 2023-07-17 | Discharge status: Home / Self Care | Payer: Managed Care, Other (non HMO) | Source: Ambulatory Visit | Attending: Family Medicine | Admitting: Family Medicine

## 2023-07-17 ENCOUNTER — Ambulatory Visit
Admission: RE | Admit: 2023-07-17 | Discharge: 2023-07-17 | Disposition: A | Discharge status: Home / Self Care | Payer: Managed Care, Other (non HMO) | Source: Ambulatory Visit | Attending: Family Medicine | Admitting: Family Medicine

## 2023-07-17 DIAGNOSIS — N6322 Unspecified lump in the left breast, upper inner quadrant: Secondary | ICD-10-CM

## 2023-07-24 ENCOUNTER — Other Ambulatory Visit: Payer: Managed Care, Other (non HMO)

## 2023-08-22 ENCOUNTER — Ambulatory Visit: Payer: Managed Care, Other (non HMO) | Admitting: Family Medicine

## 2023-09-10 ENCOUNTER — Ambulatory Visit: Payer: Managed Care, Other (non HMO) | Admitting: Family Medicine

## 2023-09-10 ENCOUNTER — Encounter: Payer: Self-pay | Admitting: Family Medicine

## 2023-09-10 VITALS — BP 103/70 | HR 65 | Ht 62.0 in | Wt 152.0 lb

## 2023-09-10 DIAGNOSIS — Z1339 Encounter for screening examination for other mental health and behavioral disorders: Secondary | ICD-10-CM

## 2023-09-10 DIAGNOSIS — Z01419 Encounter for gynecological examination (general) (routine) without abnormal findings: Secondary | ICD-10-CM

## 2023-09-10 NOTE — Progress Notes (Signed)
Subjective:     Kimberly Rich is a 47 y.o. female and is here for a comprehensive physical exam. The patient reports problems - cysts on breasts have improved.  Normal pap. Regular cycles.   The following portions of the patient's history were reviewed and updated as appropriate: allergies, current medications, past family history, past medical history, past social history, past surgical history, and problem list.  Review of Systems Pertinent items noted in HPI and remainder of comprehensive ROS otherwise negative.   Objective:  Chaperone present for exam   BP 103/70   Pulse 65   Ht 5\' 2"  (1.575 m)   Wt 152 lb (68.9 kg)   BMI 27.80 kg/m  General appearance: alert, cooperative, and appears stated age Head: Normocephalic, without obvious abnormality, atraumatic Neck: no adenopathy, supple, symmetrical, trachea midline, and thyroid not enlarged, symmetric, no tenderness/mass/nodules Lungs: clear to auscultation bilaterally Breasts:  left breast with large firm nodule about 2.5 cm midline at superior portion of the areola, right breast with cyst @ 8 o'clock and another firm cyst 2 cm noted at superior aspect of the areola. Heart: regular rate and rhythm, S1, S2 normal, no murmur, click, rub or gallop Abdomen: soft, non-tender; bowel sounds normal; no masses,  no organomegaly Extremities: extremities normal, atraumatic, no cyanosis or edema Pulses: 2+ and symmetric Skin: Skin color, texture, turgor normal. No rashes or lesions Lymph nodes: Cervical, supraclavicular, and axillary nodes normal. Neurologic: Grossly normal    Assessment:    Healthy female exam.      Plan:  Encounter for gynecological examination without abnormal finding - Had labs with [facility] through her job. Declined flu. Normal Cologuard last year. Mammogram due 02/2024.  Return in 1 year (on 09/09/2024).    See After Visit Summary for Counseling Recommendations

## 2025-03-03 ENCOUNTER — Ambulatory Visit: Admitting: Family Medicine
